# Patient Record
Sex: Male | Born: 1975 | Race: White | Hispanic: No | Marital: Married | Smoking: Never smoker
Health system: Southern US, Community
[De-identification: ages and names within clinical notes are randomized; demographics above are authoritative.]

## PROBLEM LIST (undated history)

## (undated) DIAGNOSIS — K219 Gastro-esophageal reflux disease without esophagitis: Secondary | ICD-10-CM

## (undated) DIAGNOSIS — Z973 Presence of spectacles and contact lenses: Secondary | ICD-10-CM

## (undated) DIAGNOSIS — R002 Palpitations: Secondary | ICD-10-CM

## (undated) DIAGNOSIS — I10 Essential (primary) hypertension: Secondary | ICD-10-CM

## (undated) DIAGNOSIS — G4730 Sleep apnea, unspecified: Secondary | ICD-10-CM

## (undated) HISTORY — PX: OTHER SURGICAL HISTORY: SHX169

---

## 2006-09-03 ENCOUNTER — Emergency Department (HOSPITAL_COMMUNITY): Admission: EM | Admit: 2006-09-03 | Discharge: 2006-09-03 | Payer: Self-pay | Admitting: Emergency Medicine

## 2006-09-10 ENCOUNTER — Emergency Department (HOSPITAL_COMMUNITY): Admission: EM | Admit: 2006-09-10 | Discharge: 2006-09-10 | Payer: Self-pay | Admitting: Emergency Medicine

## 2017-02-15 ENCOUNTER — Encounter (HOSPITAL_BASED_OUTPATIENT_CLINIC_OR_DEPARTMENT_OTHER): Payer: Self-pay | Admitting: *Deleted

## 2017-02-15 ENCOUNTER — Emergency Department (HOSPITAL_BASED_OUTPATIENT_CLINIC_OR_DEPARTMENT_OTHER)
Admission: EM | Admit: 2017-02-15 | Discharge: 2017-02-16 | Disposition: A | Discharge status: Home / Self Care | Payer: Self-pay | Attending: Emergency Medicine | Admitting: Emergency Medicine

## 2017-02-15 ENCOUNTER — Emergency Department (HOSPITAL_BASED_OUTPATIENT_CLINIC_OR_DEPARTMENT_OTHER): Payer: Self-pay

## 2017-02-15 DIAGNOSIS — R072 Precordial pain: Secondary | ICD-10-CM | POA: Insufficient documentation

## 2017-02-15 DIAGNOSIS — I493 Ventricular premature depolarization: Secondary | ICD-10-CM | POA: Insufficient documentation

## 2017-02-15 DIAGNOSIS — Z79899 Other long term (current) drug therapy: Secondary | ICD-10-CM | POA: Insufficient documentation

## 2017-02-15 LAB — COMPREHENSIVE METABOLIC PANEL
ALT: 34 U/L (ref 17–63)
AST: 21 U/L (ref 15–41)
Albumin: 4.5 g/dL (ref 3.5–5.0)
Alkaline Phosphatase: 60 U/L (ref 38–126)
Anion gap: 8 (ref 5–15)
BUN: 19 mg/dL (ref 6–20)
CHLORIDE: 104 mmol/L (ref 101–111)
CO2: 26 mmol/L (ref 22–32)
CREATININE: 1 mg/dL (ref 0.61–1.24)
Calcium: 9.3 mg/dL (ref 8.9–10.3)
GFR calc non Af Amer: >60 mL/min (ref >60)
Glucose, Bld: 105 mg/dL — ABNORMAL HIGH (ref 65–99)
Potassium: 3.8 mmol/L (ref 3.5–5.1)
SODIUM: 138 mmol/L (ref 135–145)
Total Bilirubin: 0.7 mg/dL (ref 0.3–1.2)
Total Protein: 7.5 g/dL (ref 6.5–8.1)

## 2017-02-15 LAB — CBC WITH DIFFERENTIAL/PLATELET
BASOS ABS: 0 10*3/uL (ref 0.0–0.1)
BASOS PCT: 1 %
EOS ABS: 0.2 10*3/uL (ref 0.0–0.7)
Eosinophils Relative: 3 %
HCT: 44 % (ref 39.0–52.0)
HEMOGLOBIN: 14.9 g/dL (ref 13.0–17.0)
Lymphocytes Relative: 36 %
Lymphs Abs: 2.9 10*3/uL (ref 0.7–4.0)
MCH: 27.9 pg (ref 26.0–34.0)
MCHC: 33.9 g/dL (ref 30.0–36.0)
MCV: 82.4 fL (ref 78.0–100.0)
Monocytes Absolute: 0.6 10*3/uL (ref 0.1–1.0)
Monocytes Relative: 7 %
NEUTROS PCT: 53 %
Neutro Abs: 4.2 10*3/uL (ref 1.7–7.7)
Platelets: 314 10*3/uL (ref 150–400)
RBC: 5.34 MIL/uL (ref 4.22–5.81)
RDW: 12.7 % (ref 11.5–15.5)
WBC: 7.8 10*3/uL (ref 4.0–10.5)

## 2017-02-15 LAB — TROPONIN I

## 2017-02-15 NOTE — ED Triage Notes (Signed)
Pt was seen for palpitations by his PCP yesterday no significant findings began having centra CP this am. Denies N/V diaphoresis radiation dizziness or weakness

## 2017-02-16 ENCOUNTER — Encounter (HOSPITAL_BASED_OUTPATIENT_CLINIC_OR_DEPARTMENT_OTHER): Payer: Self-pay | Admitting: Emergency Medicine

## 2017-02-16 LAB — TROPONIN I: Troponin I: <0.03 ng/mL (ref <0.03)

## 2017-02-16 MED ORDER — GI COCKTAIL ~~LOC~~
30.0000 mL | Freq: Once | ORAL | Status: AC
  Administered 2017-02-16: 30 mL via ORAL
  Filled 2017-02-16: qty 30

## 2017-02-16 NOTE — ED Provider Notes (Signed)
[facility] EMERGENCY DEPARTMENT Provider Note   CSN: [id number] Arrival date & time: 02/15/17  2008     History   Chief Complaint Chief Complaint  Patient presents with  . Chest Pain    HPI Jacob Estrada is a 41 y.o. male.  The history is provided by the patient.  Chest Pain   This is a new problem. The current episode started yesterday. The problem occurs constantly. The problem has not changed since onset.The pain is associated with rest and an emotional upset. The pain is present in the substernal region. The pain is moderate. The quality of the pain is described as dull. The pain does not radiate. Duration of episode(s) is 1 day. Pertinent negatives include no abdominal pain, no diaphoresis, no hemoptysis, no leg pain, no lower extremity edema, no orthopnea and no shortness of breath. Associated symptoms comments: A pvc.  Pertinent negatives for past medical history include no MI and no recent injury.  Pertinent negatives for family medical history include: no Marfan's syndrome.  No surgery, no travel no leg pain or swelling  History reviewed. No pertinent past medical history.  There are no active problems to display for this patient.   History reviewed. No pertinent surgical history.     Home Medications    Prior to Admission medications   Medication Sig Start Date End Date Taking? Authorizing Provider  omeprazole (PRILOSEC) 20 MG capsule Take 20 mg by mouth daily.   Yes [provider]    Family History History reviewed. No pertinent family history.  Social History Social History  Substance Use Topics  . Smoking status: Never Smoker  . Smokeless tobacco: Never Used  . Alcohol use No     Allergies   Patient has no known allergies.   Review of Systems Review of Systems  Constitutional: Negative for diaphoresis.  Respiratory: Negative for hemoptysis, chest tightness and shortness of breath.   Cardiovascular: Positive for chest  pain. Negative for orthopnea and leg swelling.  Gastrointestinal: Negative for abdominal pain.  All other systems reviewed and are negative.    Physical Exam Updated Vital Signs BP (!) 135/94   Pulse 62   Temp 98.2 F (36.8 C) (Oral)   Resp 11   Ht 6\' 1"  (1.854 m)   Wt 108.9 kg (240 lb)   SpO2 94%   BMI 31.66 kg/m   Physical Exam  Constitutional: He is oriented to person, place, and time. He appears well-developed and well-nourished. No distress.  HENT:  Head: Normocephalic and atraumatic.  Mouth/Throat: No oropharyngeal exudate.  Eyes: Pupils are equal, round, and reactive to light. Conjunctivae are normal.  Neck: Normal range of motion. Neck supple. No JVD present.  Cardiovascular: Normal rate, regular rhythm, normal heart sounds and intact distal pulses.   Pulmonary/Chest: Effort normal and breath sounds normal. No stridor. No respiratory distress. He has no wheezes. He has no rales.  Abdominal: Soft. Bowel sounds are normal. He exhibits no mass. There is no tenderness. There is no rebound and no guarding.  Musculoskeletal: Normal range of motion. He exhibits no edema or tenderness.  Negative Homan's sign  Neurological: He is alert and oriented to person, place, and time.  Skin: Skin is warm and dry. Capillary refill takes less than 2 seconds.     ED Treatments / Results  Labs (all labs ordered are listed, but only abnormal results are displayed)  Results for orders placed or performed during the hospital encounter of 02/15/17  CBC with  Differential  Result Value Ref Range   WBC 7.8 4.0 - 10.5 K/uL   RBC 5.34 4.22 - 5.81 MIL/uL   Hemoglobin 14.9 13.0 - 17.0 g/dL   HCT 16.1 09.6 - 04.5 %   MCV 82.4 78.0 - 100.0 fL   MCH 27.9 26.0 - 34.0 pg   MCHC 33.9 30.0 - 36.0 g/dL   RDW 40.9 81.1 - 91.4 %   Platelets 314 150 - 400 K/uL   Neutrophils Relative % 53 %   Neutro Abs 4.2 1.7 - 7.7 K/uL   Lymphocytes Relative 36 %   Lymphs Abs 2.9 0.7 - 4.0 K/uL   Monocytes  Relative 7 %   Monocytes Absolute 0.6 0.1 - 1.0 K/uL   Eosinophils Relative 3 %   Eosinophils Absolute 0.2 0.0 - 0.7 K/uL   Basophils Relative 1 %   Basophils Absolute 0.0 0.0 - 0.1 K/uL  Troponin I  Result Value Ref Range   Troponin I <0.03 <0.03 ng/mL  Comprehensive metabolic panel  Result Value Ref Range   Sodium 138 135 - 145 mmol/L   Potassium 3.8 3.5 - 5.1 mmol/L   Chloride 104 101 - 111 mmol/L   CO2 26 22 - 32 mmol/L   Glucose, Bld 105 (H) 65 - 99 mg/dL   BUN 19 6 - 20 mg/dL   Creatinine, Ser 7.82 0.61 - 1.24 mg/dL   Calcium 9.3 8.9 - 95.6 mg/dL   Total Protein 7.5 6.5 - 8.1 g/dL   Albumin 4.5 3.5 - 5.0 g/dL   AST 21 15 - 41 U/L   ALT 34 17 - 63 U/L   Alkaline Phosphatase 60 38 - 126 U/L   Total Bilirubin 0.7 0.3 - 1.2 mg/dL   GFR calc non Af Amer >60 >60 mL/min   GFR calc Af Amer >60 >60 mL/min   Anion gap 8 5 - 15  Troponin I  Result Value Ref Range   Troponin I <0.03 <0.03 ng/mL   Dg Chest 2 View  Result Date: 02/15/2017 CLINICAL DATA:  Palpitations and premature heart beat. Chest pain. Left-sided chest numbness. Nonsmoker. EXAM: CHEST  2 VIEW COMPARISON:  None. FINDINGS: The heart size and mediastinal contours are within normal limits. Both lungs are clear. The visualized skeletal structures are unremarkable. IMPRESSION: No active cardiopulmonary disease. Electronically Signed   By: Burman Nieves M.D.   On: 02/15/2017 21:48    EKG  EKG Interpretation  Date/Time:  Thursday February 15 2017 20:17:40 EDT Ventricular Rate:  81 PR Interval:  144 QRS Duration: 88 QT Interval:  362 QTC Calculation: 420 R Axis:   72 Text Interpretation:  Sinus rhythm with sinus arrhythmia  PVC Confirmed by Nicanor Alcon, Buell Parcel ([id number]) on 02/15/2017 11:27:36 PM       Radiology Dg Chest 2 View  Result Date: 02/15/2017 CLINICAL DATA:  Palpitations and premature heart beat. Chest pain. Left-sided chest numbness. Nonsmoker. EXAM: CHEST  2 VIEW COMPARISON:  None. FINDINGS: The heart  size and mediastinal contours are within normal limits. Both lungs are clear. The visualized skeletal structures are unremarkable. IMPRESSION: No active cardiopulmonary disease. Electronically Signed   By: Burman Nieves M.D.   On: 02/15/2017 21:48    Procedures Procedures (including critical care time)  Medications Ordered in ED Medications  gi cocktail (Maalox,Lidocaine,Donnatal) (30 mLs Oral Given 02/16/17 0103)   PERC negative Wells 0 highly doubt PE in this very low risk patient.  Sats 100% no SOB or leg pain   Final Clinical Impressions(s) /  ED Diagnoses   Final diagnoses:  Precordial pain  PVC (premature ventricular contraction)   Follow up with your PMD to discuss thyroid testing and holter monitor, no caffeine, lower your stress level.    Strict return precautions given for  Shortness of breath, swelling or the lips or tongue, chest pain, dyspnea on exertion, new weakness or numbness changes in vision or speech,  Inability to tolerate liquids or food, changes in voice cough, altered mental status or any concerns. No signs of systemic illness or infection. The patient is nontoxic-appearing on exam and vital signs are within normal limits.    I have reviewed the triage vital signs and the nursing notes. Pertinent labs &imaging results that were available during my care of the patient were reviewed by me and considered in my medical decision making (see chart for details).  After history, exam, and medical workup I feel the patient has been appropriately medically screened and is safe for discharge home. Pertinent diagnoses were discussed with the patient. Patient was given return precautions.      Callaway Hailes, MD 02/16/17 [phone]

## 2017-07-06 ENCOUNTER — Other Ambulatory Visit: Payer: Self-pay | Admitting: Orthopedic Surgery

## 2017-07-06 DIAGNOSIS — M25561 Pain in right knee: Principal | ICD-10-CM

## 2017-07-06 DIAGNOSIS — G8929 Other chronic pain: Secondary | ICD-10-CM

## 2017-07-16 ENCOUNTER — Ambulatory Visit
Admission: RE | Admit: 2017-07-16 | Discharge: 2017-07-16 | Disposition: A | Discharge status: Home / Self Care | Payer: Self-pay | Source: Ambulatory Visit | Attending: Orthopedic Surgery | Admitting: Orthopedic Surgery

## 2017-07-16 DIAGNOSIS — G8929 Other chronic pain: Secondary | ICD-10-CM

## 2017-07-16 DIAGNOSIS — M25561 Pain in right knee: Principal | ICD-10-CM

## 2018-05-29 ENCOUNTER — Other Ambulatory Visit: Payer: Self-pay | Admitting: Orthopedic Surgery

## 2018-05-29 DIAGNOSIS — R609 Edema, unspecified: Secondary | ICD-10-CM

## 2018-05-29 DIAGNOSIS — R52 Pain, unspecified: Secondary | ICD-10-CM

## 2018-05-29 DIAGNOSIS — R531 Weakness: Secondary | ICD-10-CM

## 2018-06-03 ENCOUNTER — Ambulatory Visit
Admission: RE | Admit: 2018-06-03 | Discharge: 2018-06-03 | Disposition: A | Discharge status: Home / Self Care | Payer: 59 | Source: Ambulatory Visit | Attending: Orthopedic Surgery | Admitting: Orthopedic Surgery

## 2018-06-03 DIAGNOSIS — R531 Weakness: Secondary | ICD-10-CM

## 2018-06-03 DIAGNOSIS — R52 Pain, unspecified: Secondary | ICD-10-CM

## 2018-06-03 DIAGNOSIS — R609 Edema, unspecified: Secondary | ICD-10-CM

## 2018-06-04 ENCOUNTER — Other Ambulatory Visit: Payer: Self-pay

## 2018-09-29 IMAGING — DX DG CHEST 2V
2 series · 2 of 2 positions shown · non-contrast
Comparison: None.

CLINICAL DATA: Palpitations and premature heart beat. Chest pain.
Left-sided chest numbness. Nonsmoker.

EXAM:
CHEST  2 VIEW

[chest pa]
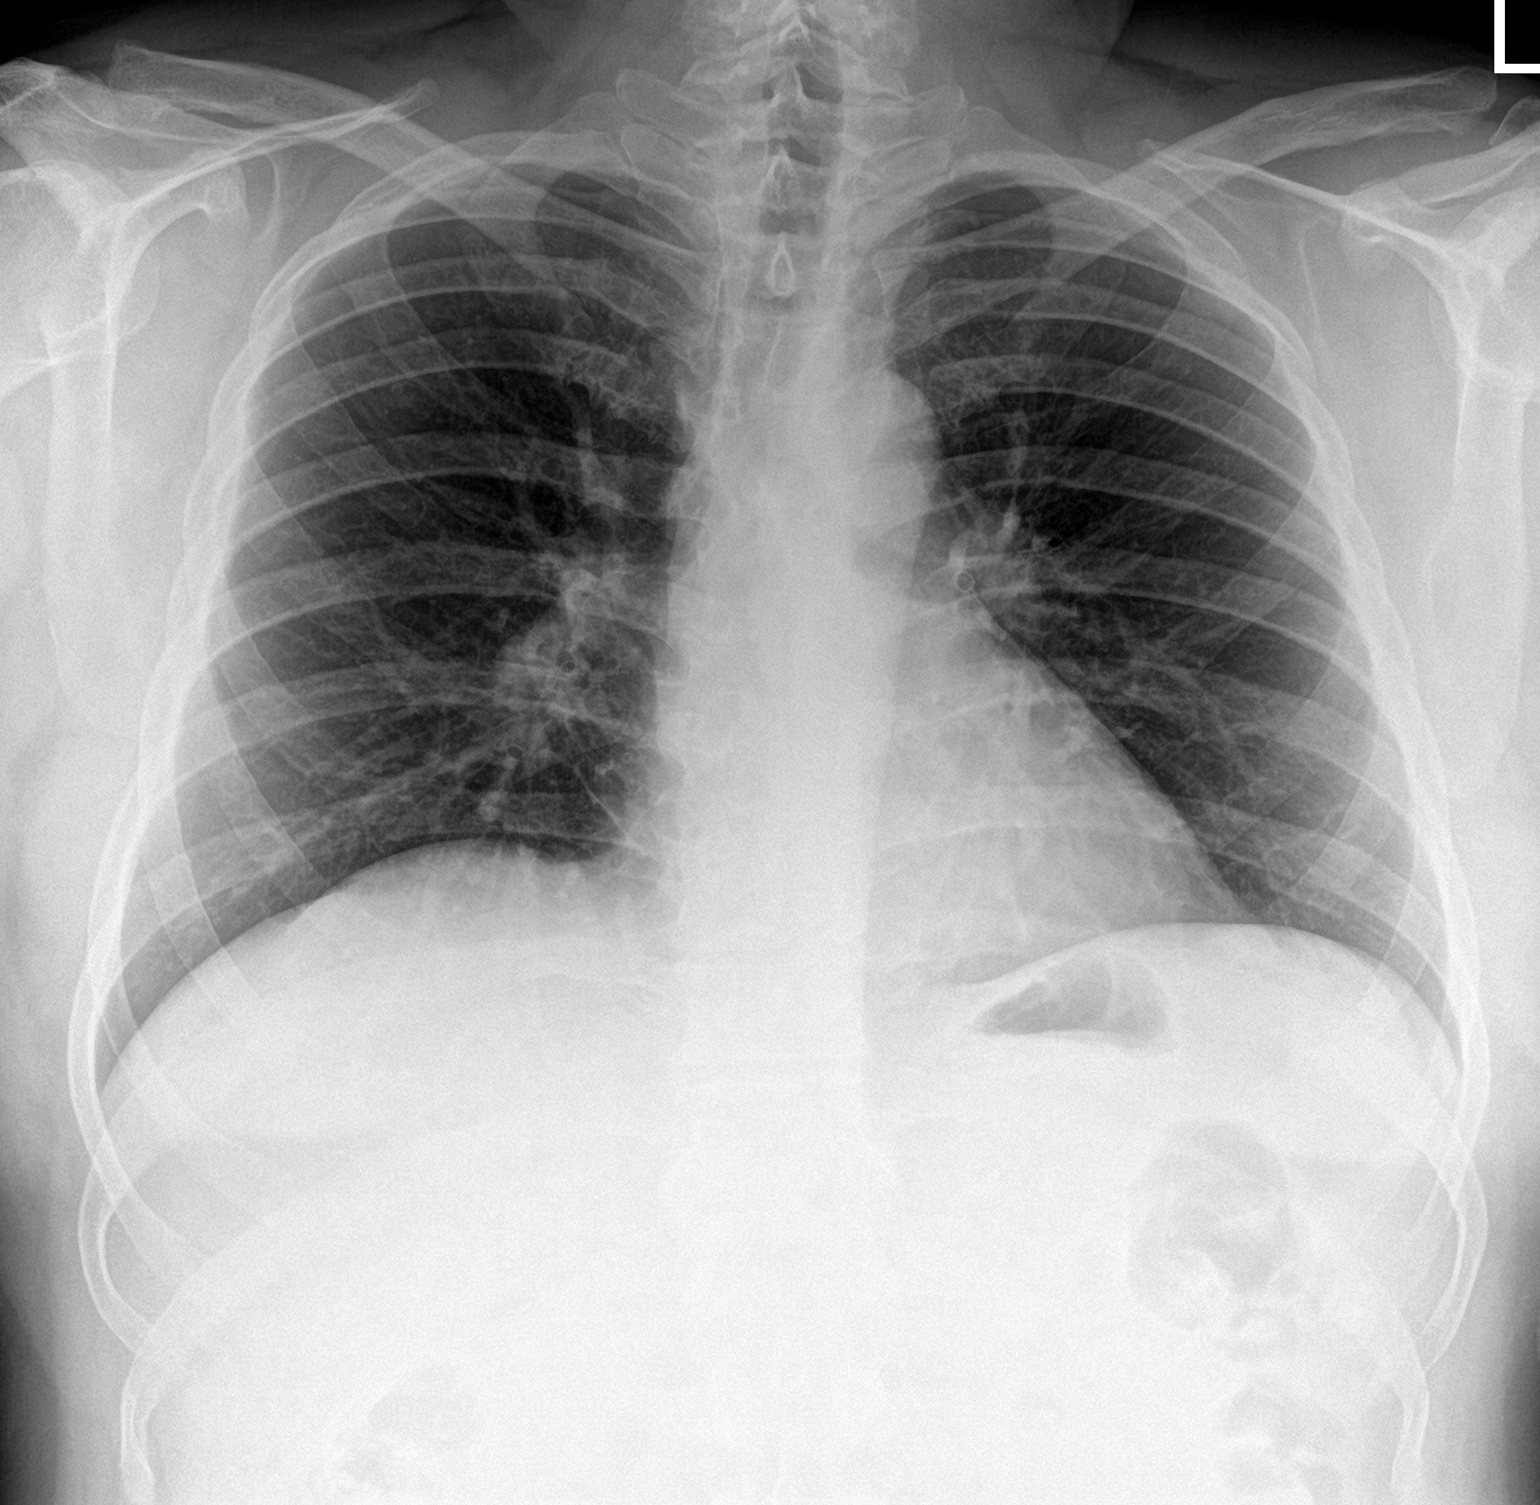

[chest lat]
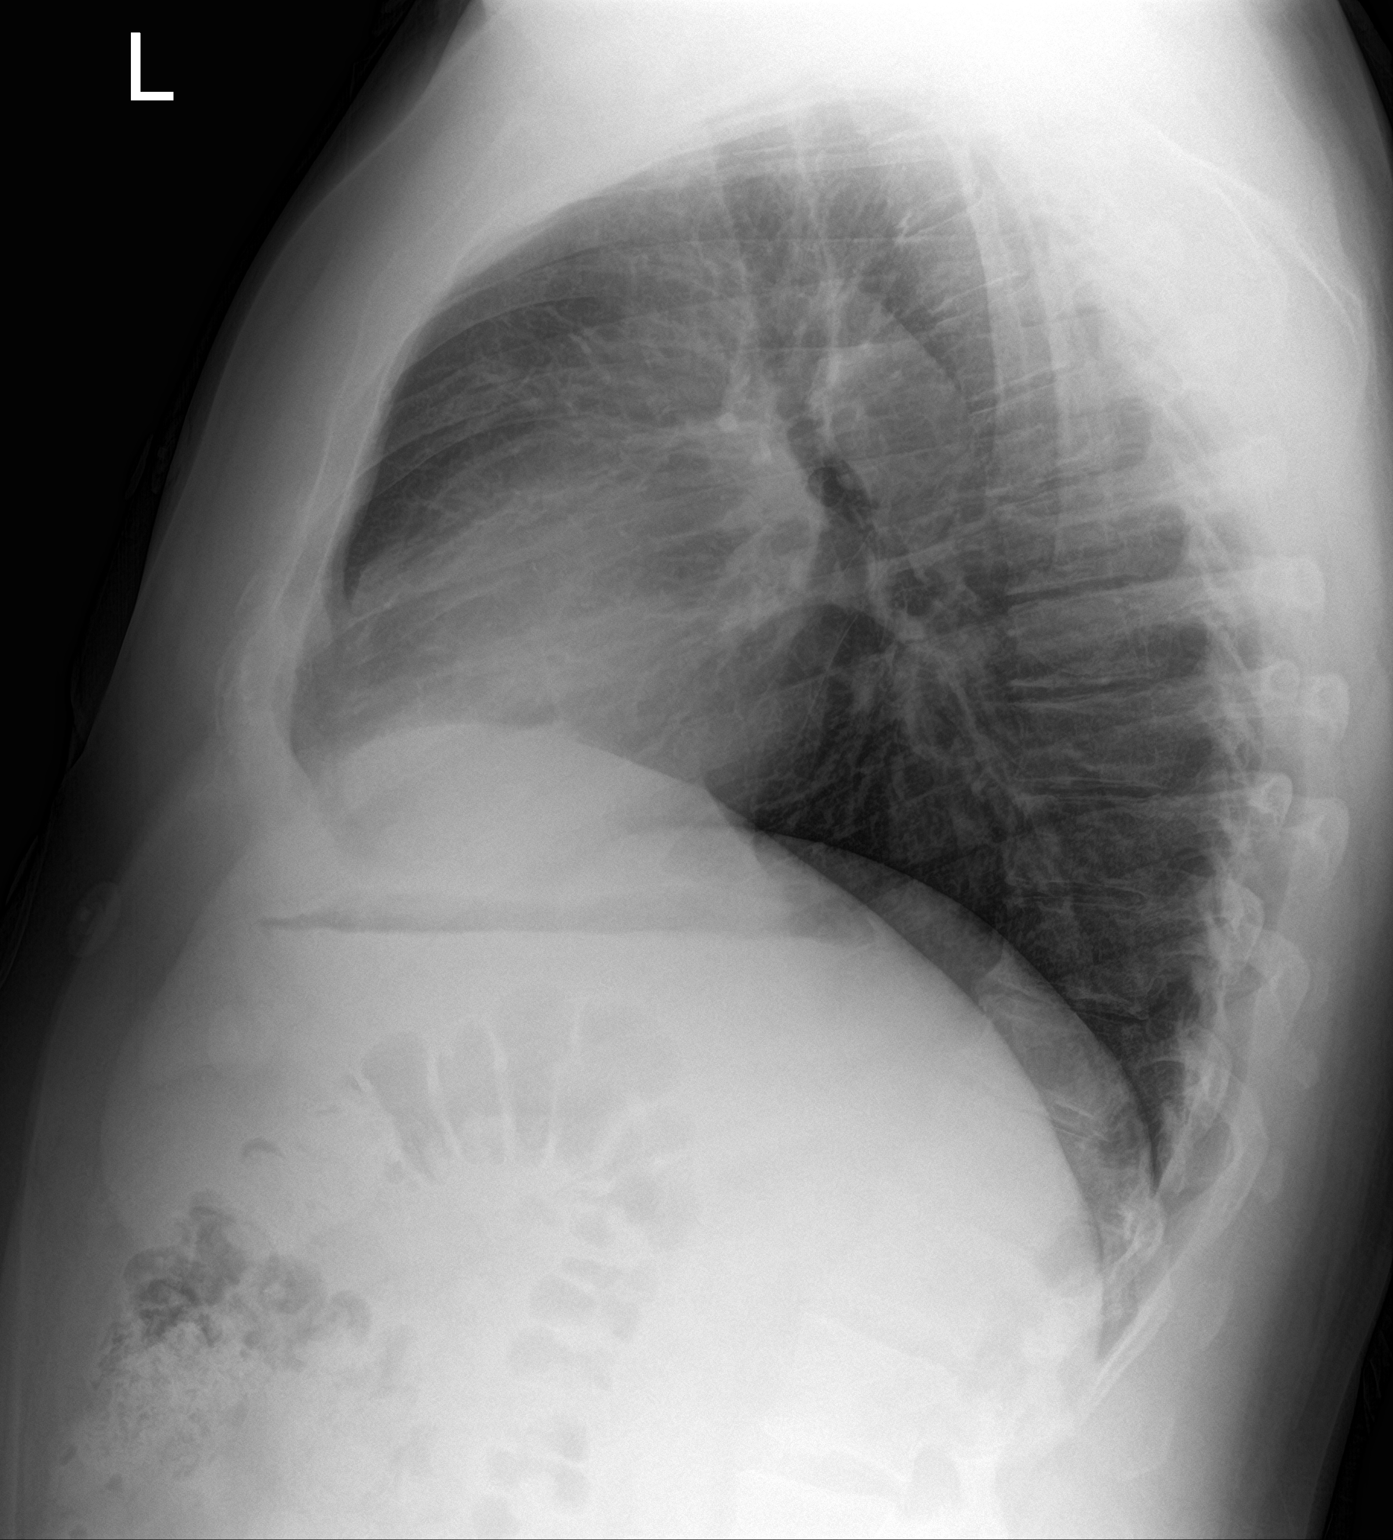

[2 of 2 positions shown; findings below may reference images not displayed]

FINDINGS: The heart size and mediastinal contours are within normal limits.
Both lungs are clear. The visualized skeletal structures are
unremarkable.
IMPRESSION: No active cardiopulmonary disease.

## 2019-02-27 IMAGING — MR MR KNEE*R* W/O CM
4 of 5 series · 31 of 40 positions shown · non-contrast
Comparison: None.

CLINICAL DATA: Right knee pain x4 years. Raised area beneath the
skin along the lateral aspect with markers placed. Exercise worsens
symptoms.

EXAM:
MRI OF THE RIGHT KNEE WITHOUT CONTRAST
TECHNIQUE: Multiplanar, multisequence MR imaging of the knee was performed. No
intravenous contrast was administered.

[Series 15: PD fat-sat · axial · right · 3.0mm · 0.39mm/px · z∈[-29,+98]mm · 9 of 40 slices shown (1 of 3)]
[im 1/40]
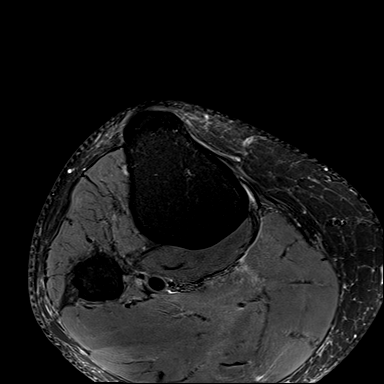
[im 5/40]
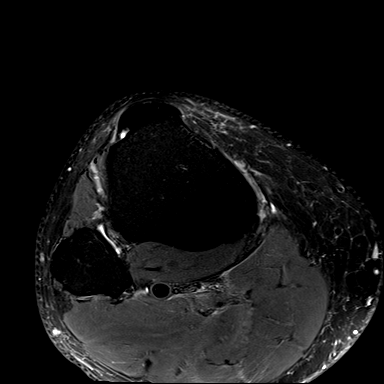
[im 10/40]
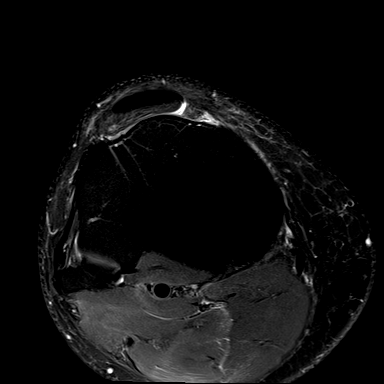
[im 15/40]
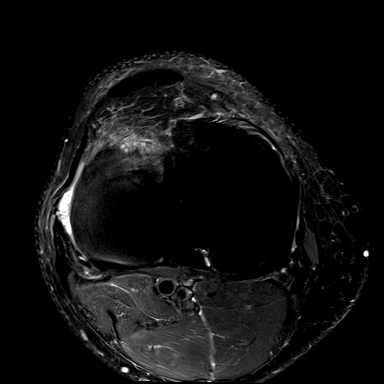
[im 20/40]
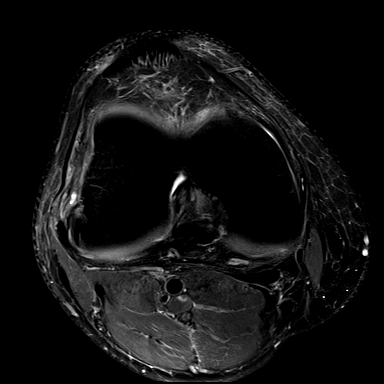
[im 25/40]
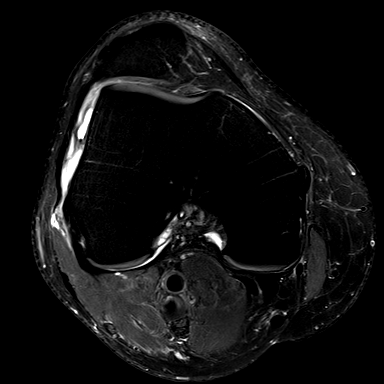
[im 30/40]
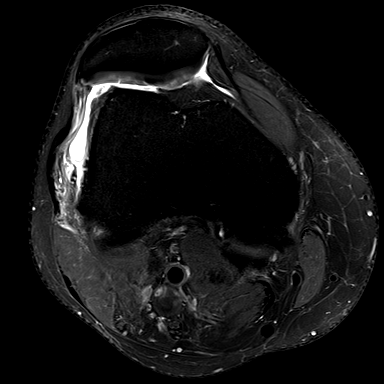
[im 35/40]
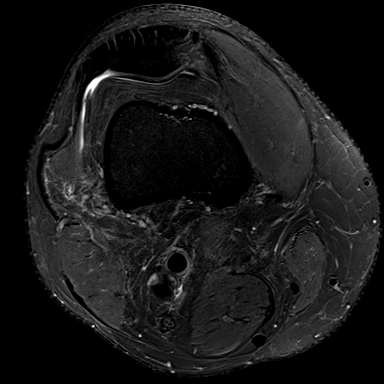
[im 40/40]
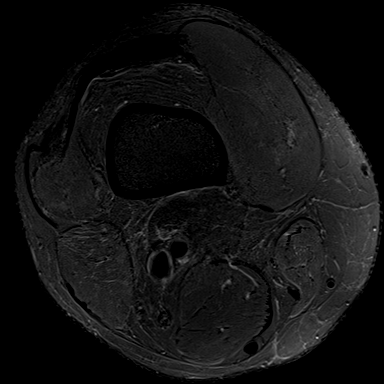

[Series 16: PD fat-sat · sagittal · right · 3.0mm · 0.39mm/px · 7 of 30 slices shown (2 of 3)]
[im 1/30]
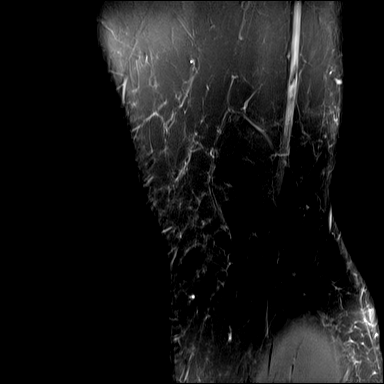
[im 5/30]
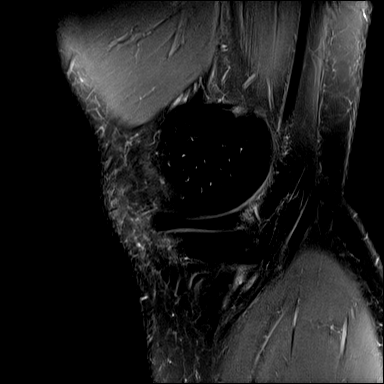
[im 10/30]
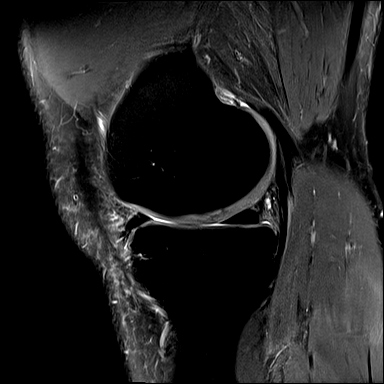
[im 15/30]
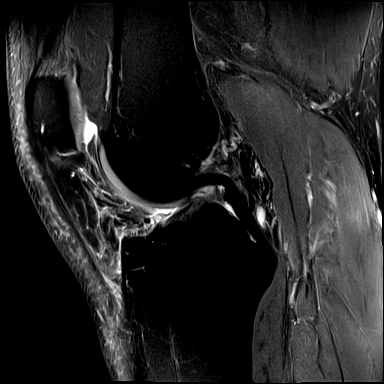
[im 20/30]
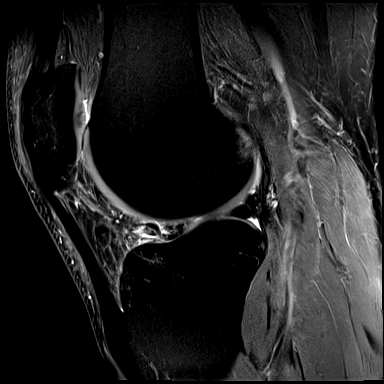
[im 25/30]
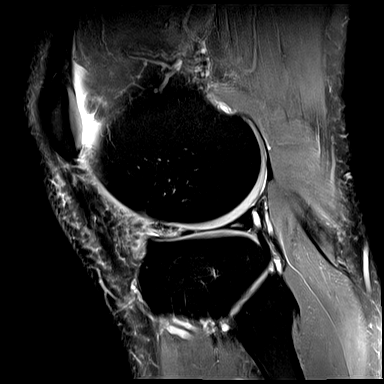
[im 30/30]
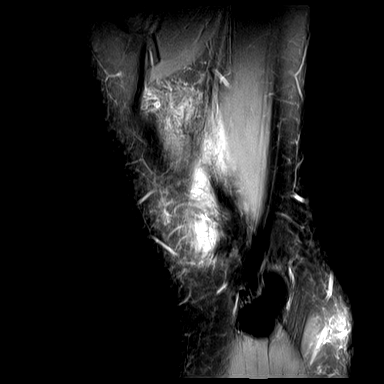

[Series 18: PD fat-sat · coronal · right · 3.0mm · 0.33mm/px · 8 of 33 slices shown (3 of 3)]
[im 1/33]
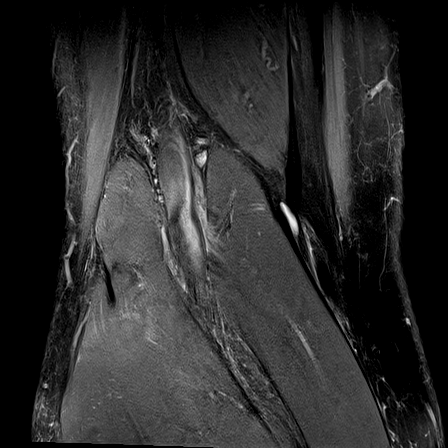
[im 5/33]
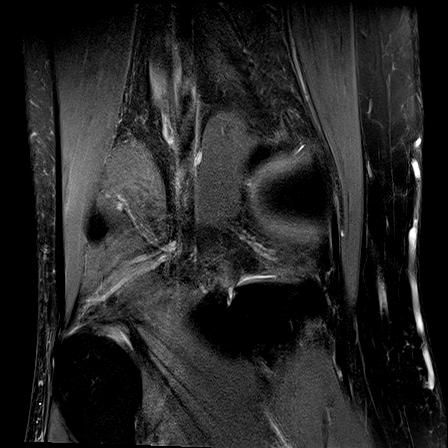
[im 10/33]
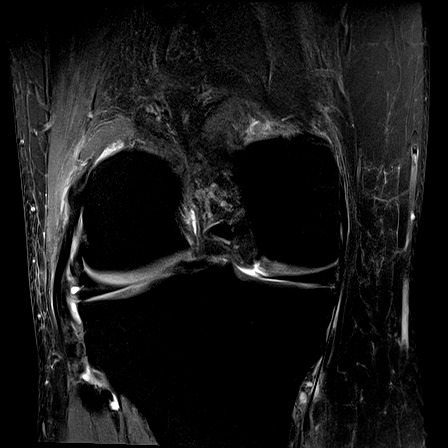
[im 14/33]
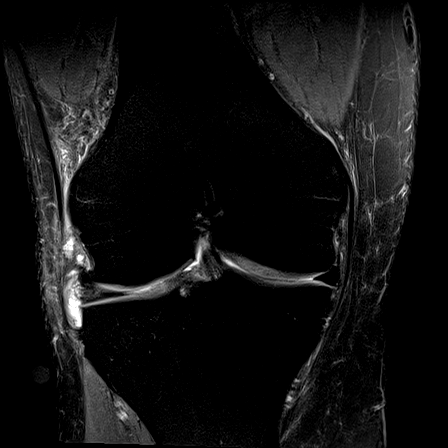
[im 19/33]
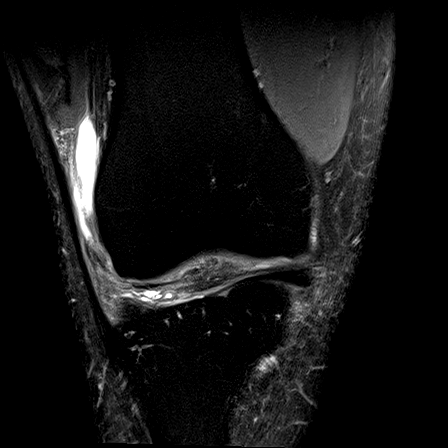
[im 23/33]
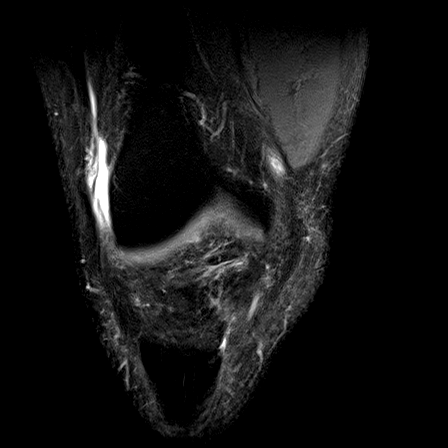
[im 28/33]
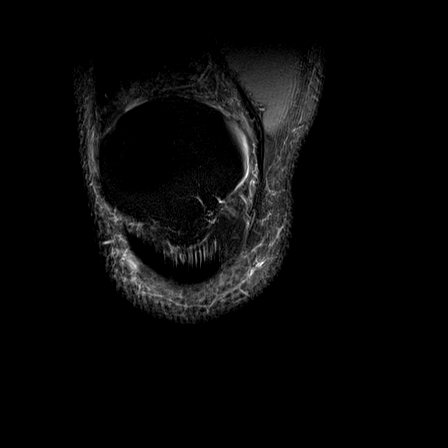
[im 33/33]
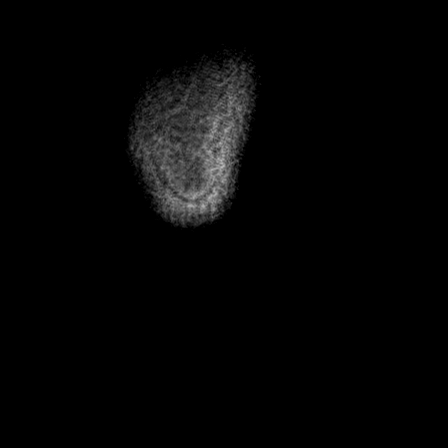

[Series 19: T2 fat-sat · coronal · right · 3.0mm · 0.39mm/px · 7 of 33 slices shown]
[im 1/33]
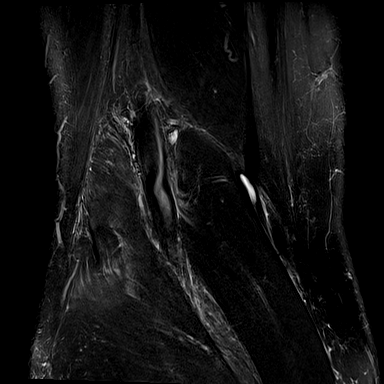
[im 5/33]
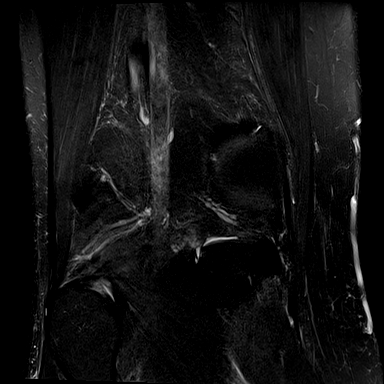
[im 10/33]
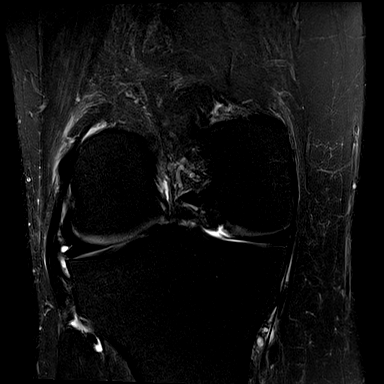
[im 14/33]
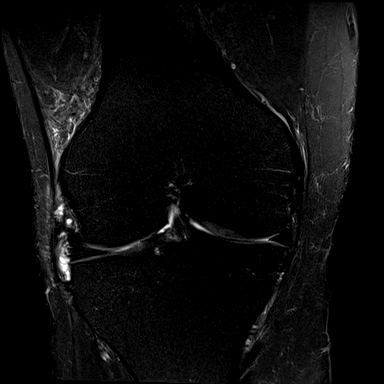
[im 19/33]
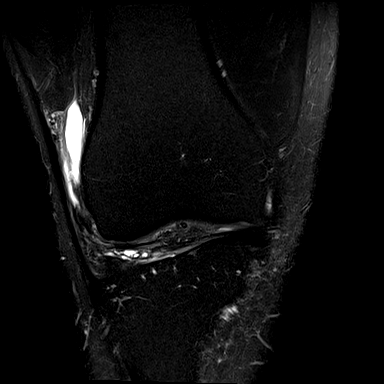
[im 23/33]
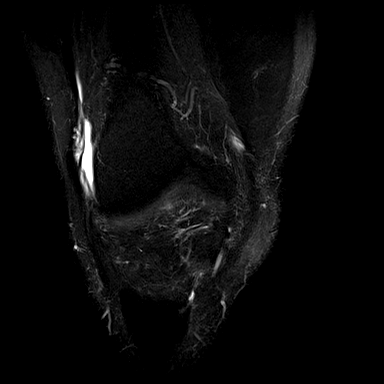
[im 28/33]
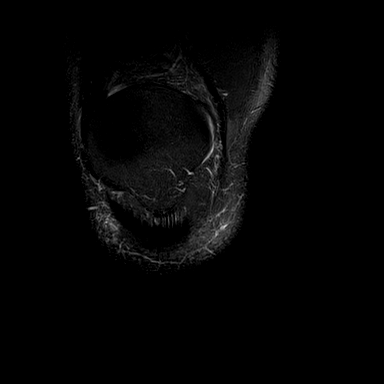

[31 of 40 positions shown; findings below may reference images not displayed]

FINDINGS: MENISCI

Medial meniscus:  Intact.

Lateral meniscus: A complex tear of the anteromesial horn of the
lateral meniscus is noted surfacing both superiorly and inferiorly.
A horizontal component extends from this region to the body of the
lateral meniscus where there are 2 small parameniscal cysts on the
order of 2-3 mm noted, series [DATE].

LIGAMENTS

Cruciates:  Intact

Collaterals: Thickened edematous lateral collateral ligament with
overlying edema. Intact MCL.

CARTILAGE

Patellofemoral: Slight fissuring of the cartilage overlying the
lateral and medial patella as well as the cartilage overlying the
median ridge and mid pole. Intact trochlear cartilage.

Medial: Mild shallow delamination of the cartilage overlying the
posterior aspect of the medial femoral condyle, series [DATE].

Lateral:  Intact

Joint: No significant joint effusion. Mild edema of Hoffa's fat pad
with deep infrapatellar bursal fluid.

Popliteal Fossa:  No popliteal cyst.  Intact popliteus.

Extensor Mechanism:  Intact extensor mechanism tendons and MPFL.

Bones: No focal marrow signal abnormality. No fracture or
dislocation.

Other: None.
IMPRESSION: 1. Complex tear of the anteromesial corner of the lateral meniscus
with horizontal component extending to the body of the lateral
meniscus where there are a few small parameniscal cysts noted.
2. Minimal fissuring of the patellar cartilage overlying the lateral
and medial patellar facets as well as median ridge the midpole.
3. Mild shallow delamination of the cartilage overlying the medial
femoral condyle.
4. Grade 2 sprain of the LCL with overlying soft tissue edema.
5. Deep infrapatellar bursitis.

## 2022-03-11 ENCOUNTER — Other Ambulatory Visit: Payer: Self-pay | Admitting: Urology

## 2022-03-11 ENCOUNTER — Other Ambulatory Visit: Payer: Self-pay

## 2022-03-11 DIAGNOSIS — N50811 Right testicular pain: Secondary | ICD-10-CM

## 2022-03-14 ENCOUNTER — Other Ambulatory Visit: Payer: 59

## 2022-03-16 ENCOUNTER — Ambulatory Visit
Admission: RE | Admit: 2022-03-16 | Discharge: 2022-03-16 | Disposition: A | Discharge status: Home / Self Care | Payer: BC Managed Care – PPO | Source: Ambulatory Visit | Attending: Urology | Admitting: Urology

## 2022-03-16 DIAGNOSIS — N50811 Right testicular pain: Secondary | ICD-10-CM

## 2022-06-20 ENCOUNTER — Other Ambulatory Visit: Payer: Self-pay | Admitting: Urology

## 2022-08-11 ENCOUNTER — Encounter (HOSPITAL_BASED_OUTPATIENT_CLINIC_OR_DEPARTMENT_OTHER): Admission: RE | Payer: Self-pay | Source: Home / Self Care

## 2022-08-11 ENCOUNTER — Ambulatory Visit (HOSPITAL_BASED_OUTPATIENT_CLINIC_OR_DEPARTMENT_OTHER): Admission: RE | Admit: 2022-08-11 | Payer: BC Managed Care – PPO | Source: Home / Self Care | Admitting: Urology

## 2022-08-11 SURGERY — EXPLORATION, SCROTUM
Anesthesia: General

## 2022-12-26 ENCOUNTER — Other Ambulatory Visit: Payer: Self-pay | Admitting: Urology

## 2023-01-08 ENCOUNTER — Other Ambulatory Visit: Payer: Self-pay

## 2023-01-08 ENCOUNTER — Encounter (HOSPITAL_BASED_OUTPATIENT_CLINIC_OR_DEPARTMENT_OTHER): Payer: Self-pay | Admitting: Urology

## 2023-01-08 NOTE — Progress Notes (Addendum)
Spoke w/ via phone for pre-op interview---pt Lab needs dos---- I stat, ekg              Lab results------see below COVID test -----patient states asymptomatic no test needed Arrive at -------800 01-12-2023 NPO after MN NO Solid Food.  Clear liquids from MN until---700 Med rec completed Medications to take morning of surgery -----atorvastain, sertraline, atenolol Diabetic medication -----n/a Patient instructed no nail polish to be worn day of surgery Patient instructed to bring photo id and insurance card day of surgery Patient aware to have Driver (ride ) / caregiver    for 24 hours after surgery wife [facility] Patient Special Instructions -----pt received no instructions per dr gay on 81 mg asa, last dose to be 01-11-2023 Pre-Op special Instructions -----bring cpap mask tubing and maching and leave in car Patient verbalized understanding of instructions that were given at this phone interview. Patient denies shortness of breath, chest pain, fever, cough at this phone interview.  Er visit [facility] 07-29-2022  epic (saw for chest pain)  Er visit [facility] 08-01-2022  epic (saw for chest pain)  Pcp visit annual physical exam 11-06-2022 [facility]ra[facility] epic  Reviewed patient history and er visits 07-29-2022 and 08-01-2022 and pcp annual physical exam with dr s, [facility] mda, pt ok for [facility] surgery 01-12-2023 per dr s Alben Deeds mda.

## 2023-01-12 ENCOUNTER — Encounter (HOSPITAL_BASED_OUTPATIENT_CLINIC_OR_DEPARTMENT_OTHER)
Admission: RE | Disposition: A | Discharge status: Home / Self Care | Payer: Self-pay | Source: Home / Self Care | Attending: Urology

## 2023-01-12 ENCOUNTER — Encounter (HOSPITAL_BASED_OUTPATIENT_CLINIC_OR_DEPARTMENT_OTHER): Payer: Self-pay | Admitting: Urology

## 2023-01-12 ENCOUNTER — Ambulatory Visit (HOSPITAL_BASED_OUTPATIENT_CLINIC_OR_DEPARTMENT_OTHER)
Admission: RE | Admit: 2023-01-12 | Discharge: 2023-01-12 | Disposition: A | Discharge status: Home / Self Care | Payer: BC Managed Care – PPO | Attending: Urology | Admitting: Urology

## 2023-01-12 ENCOUNTER — Other Ambulatory Visit: Payer: Self-pay

## 2023-01-12 ENCOUNTER — Ambulatory Visit (HOSPITAL_BASED_OUTPATIENT_CLINIC_OR_DEPARTMENT_OTHER): Payer: BC Managed Care – PPO | Admitting: Anesthesiology

## 2023-01-12 DIAGNOSIS — N509 Disorder of male genital organs, unspecified: Secondary | ICD-10-CM

## 2023-01-12 DIAGNOSIS — I10 Essential (primary) hypertension: Secondary | ICD-10-CM | POA: Diagnosis not present

## 2023-01-12 DIAGNOSIS — G4730 Sleep apnea, unspecified: Secondary | ICD-10-CM | POA: Insufficient documentation

## 2023-01-12 DIAGNOSIS — L729 Follicular cyst of the skin and subcutaneous tissue, unspecified: Secondary | ICD-10-CM | POA: Diagnosis present

## 2023-01-12 DIAGNOSIS — Z01818 Encounter for other preprocedural examination: Secondary | ICD-10-CM

## 2023-01-12 DIAGNOSIS — N5082 Scrotal pain: Secondary | ICD-10-CM | POA: Insufficient documentation

## 2023-01-12 HISTORY — DX: Essential (primary) hypertension: I10

## 2023-01-12 HISTORY — DX: Gastro-esophageal reflux disease without esophagitis: K21.9

## 2023-01-12 HISTORY — DX: Presence of spectacles and contact lenses: Z97.3

## 2023-01-12 HISTORY — PX: SCROTAL EXPLORATION: SHX2386

## 2023-01-12 HISTORY — DX: Palpitations: R00.2

## 2023-01-12 HISTORY — DX: Sleep apnea, unspecified: G47.30

## 2023-01-12 HISTORY — PX: VASECTOMY: SHX75

## 2023-01-12 LAB — POCT I-STAT, CHEM 8
BUN: 20 mg/dL (ref 6–20)
Calcium, Ion: 1.23 mmol/L (ref 1.15–1.40)
Chloride: 104 mmol/L (ref 98–111)
Creatinine, Ser: 0.9 mg/dL (ref 0.61–1.24)
Glucose, Bld: 115 mg/dL — ABNORMAL HIGH (ref 70–99)
HCT: 45 % (ref 39.0–52.0)
Hemoglobin: 15.3 g/dL (ref 13.0–17.0)
Potassium: 4.3 mmol/L (ref 3.5–5.1)
Sodium: 141 mmol/L (ref 135–145)
TCO2: 24 mmol/L (ref 22–32)

## 2023-01-12 SURGERY — EXPLORATION, SCROTUM
Anesthesia: General | Laterality: Right

## 2023-01-12 MED ORDER — EPHEDRINE SULFATE (PRESSORS) 50 MG/ML IJ SOLN
INTRAMUSCULAR | Status: DC | PRN
  Administered 2023-01-12: 5 mg via INTRAVENOUS

## 2023-01-12 MED ORDER — MIDAZOLAM HCL 2 MG/2ML IJ SOLN
INTRAMUSCULAR | Status: AC
  Filled 2023-01-12: qty 2

## 2023-01-12 MED ORDER — LIDOCAINE HCL (PF) 2 % IJ SOLN
INTRAMUSCULAR | Status: AC
  Filled 2023-01-12: qty 5

## 2023-01-12 MED ORDER — PROPOFOL 10 MG/ML IV BOLUS
INTRAVENOUS | Status: AC
  Filled 2023-01-12: qty 20

## 2023-01-12 MED ORDER — CEPHALEXIN 500 MG PO CAPS: 500.0000 mg | ORAL_CAPSULE | Freq: Two times a day (BID) | ORAL | 0 refills | Status: AC

## 2023-01-12 MED ORDER — OXYCODONE-ACETAMINOPHEN 5-325 MG PO TABS
1.0000 | ORAL_TABLET | ORAL | 0 refills | Status: AC | PRN
Start: 2023-01-12 — End: ?

## 2023-01-12 MED ORDER — CEFAZOLIN SODIUM-DEXTROSE 2-4 GM/100ML-% IV SOLN
INTRAVENOUS | Status: AC
  Filled 2023-01-12: qty 100

## 2023-01-12 MED ORDER — KETOROLAC TROMETHAMINE 30 MG/ML IJ SOLN
INTRAMUSCULAR | Status: DC | PRN
  Administered 2023-01-12: 30 mg via INTRAVENOUS

## 2023-01-12 MED ORDER — FENTANYL CITRATE (PF) 100 MCG/2ML IJ SOLN
INTRAMUSCULAR | Status: AC
  Filled 2023-01-12: qty 2

## 2023-01-12 MED ORDER — FENTANYL CITRATE (PF) 100 MCG/2ML IJ SOLN: 25.0000 ug | INTRAMUSCULAR | Status: DC | PRN

## 2023-01-12 MED ORDER — CEFAZOLIN SODIUM-DEXTROSE 2-4 GM/100ML-% IV SOLN
2.0000 g | INTRAVENOUS | Status: AC
  Administered 2023-01-12: 2 g via INTRAVENOUS

## 2023-01-12 MED ORDER — DEXAMETHASONE SODIUM PHOSPHATE 10 MG/ML IJ SOLN
INTRAMUSCULAR | Status: DC | PRN
  Administered 2023-01-12: 10 mg via INTRAVENOUS

## 2023-01-12 MED ORDER — ACETAMINOPHEN 500 MG PO TABS
1000.0000 mg | ORAL_TABLET | Freq: Once | ORAL | Status: AC
  Administered 2023-01-12: 1000 mg via ORAL

## 2023-01-12 MED ORDER — LIDOCAINE HCL (PF) 1 % IJ SOLN
INTRAMUSCULAR | Status: DC | PRN
  Administered 2023-01-12: 18 mL

## 2023-01-12 MED ORDER — DROPERIDOL 2.5 MG/ML IJ SOLN: 0.6250 mg | Freq: Once | INTRAMUSCULAR | Status: DC | PRN

## 2023-01-12 MED ORDER — ONDANSETRON HCL 4 MG/2ML IJ SOLN
INTRAMUSCULAR | Status: DC | PRN
  Administered 2023-01-12: 4 mg via INTRAVENOUS

## 2023-01-12 MED ORDER — OXYCODONE HCL 5 MG PO TABS: 5.0000 mg | ORAL_TABLET | Freq: Once | ORAL | Status: DC | PRN

## 2023-01-12 MED ORDER — ONDANSETRON HCL 4 MG/2ML IJ SOLN
INTRAMUSCULAR | Status: AC
  Filled 2023-01-12: qty 2

## 2023-01-12 MED ORDER — DOCUSATE SODIUM 100 MG PO CAPS: 100.0000 mg | ORAL_CAPSULE | Freq: Every day | ORAL | 0 refills | Status: AC | PRN

## 2023-01-12 MED ORDER — ACETAMINOPHEN 500 MG PO TABS
ORAL_TABLET | ORAL | Status: AC
  Filled 2023-01-12: qty 2

## 2023-01-12 MED ORDER — OXYCODONE HCL 5 MG/5ML PO SOLN: 5.0000 mg | Freq: Once | ORAL | Status: DC | PRN

## 2023-01-12 MED ORDER — LACTATED RINGERS IV SOLN: INTRAVENOUS | Status: DC

## 2023-01-12 MED ORDER — LIDOCAINE 2% (20 MG/ML) 5 ML SYRINGE
INTRAMUSCULAR | Status: DC | PRN
  Administered 2023-01-12: 100 mg via INTRAVENOUS

## 2023-01-12 MED ORDER — PROPOFOL 10 MG/ML IV BOLUS
INTRAVENOUS | Status: DC | PRN
  Administered 2023-01-12: 200 mg via INTRAVENOUS

## 2023-01-12 MED ORDER — MIDAZOLAM HCL 5 MG/5ML IJ SOLN
INTRAMUSCULAR | Status: DC | PRN
  Administered 2023-01-12: 2 mg via INTRAVENOUS

## 2023-01-12 MED ORDER — DEXAMETHASONE SODIUM PHOSPHATE 10 MG/ML IJ SOLN
INTRAMUSCULAR | Status: AC
  Filled 2023-01-12: qty 1

## 2023-01-12 MED ORDER — FENTANYL CITRATE (PF) 100 MCG/2ML IJ SOLN
INTRAMUSCULAR | Status: DC | PRN
  Administered 2023-01-12 (×2): 50 ug via INTRAVENOUS

## 2023-01-12 SURGICAL SUPPLY — 49 items
ADH SKN CLS APL DERMABOND .7 (GAUZE/BANDAGES/DRESSINGS) ×2
BLADE CLIPPER SENSICLIP SURGIC (BLADE) ×2 IMPLANT
BLADE SURG 15 STRL LF DISP TIS (BLADE) ×2 IMPLANT
BLADE SURG 15 STRL SS (BLADE) ×2
BNDG GAUZE DERMACEA FLUFF 4 (GAUZE/BANDAGES/DRESSINGS) IMPLANT
BNDG GZE DERMACEA 4 6PLY (GAUZE/BANDAGES/DRESSINGS)
CLIP TI MEDIUM 6 (CLIP) ×2 IMPLANT
COVER BACK TABLE 60X90IN (DRAPES) ×2 IMPLANT
COVER MAYO STAND STRL (DRAPES) ×2 IMPLANT
COVER PROBE U/S 5X48 (MISCELLANEOUS) IMPLANT
DERMABOND ADVANCED .7 DNX12 (GAUZE/BANDAGES/DRESSINGS) IMPLANT
DISSECTOR ROUND CHERRY 3/8 STR (MISCELLANEOUS) IMPLANT
DRAIN PENROSE 0.5X18 (DRAIN) ×2 IMPLANT
DRAPE LAPAROTOMY 100X72 PEDS (DRAPES) ×2 IMPLANT
DRSG TEGADERM 2-3/8X2-3/4 SM (GAUZE/BANDAGES/DRESSINGS) IMPLANT
ELECT NDL TIP 2.8 STRL (NEEDLE) ×2 IMPLANT
ELECT NEEDLE TIP 2.8 STRL (NEEDLE)
ELECT REM PT RETURN 9FT ADLT (ELECTROSURGICAL) ×2
ELECTRODE REM PT RTRN 9FT ADLT (ELECTROSURGICAL) ×2 IMPLANT
GAUZE 4X4 16PLY ~~LOC~~+RFID DBL (SPONGE) ×2 IMPLANT
GLOVE BIO SURGEON STRL SZ7 (GLOVE) ×2 IMPLANT
GLOVE BIO SURGEON STRL SZ7.5 (GLOVE) ×2 IMPLANT
GOWN STRL REUS W/TWL LRG LVL3 (GOWN DISPOSABLE) ×4 IMPLANT
GOWN STRL REUS W/TWL XL LVL3 (GOWN DISPOSABLE) ×2 IMPLANT
KIT TURNOVER CYSTO (KITS) ×2 IMPLANT
NDL HYPO 25X1 1.5 SAFETY (NEEDLE) ×2 IMPLANT
NEEDLE HYPO 25X1 1.5 SAFETY (NEEDLE) ×2
NS IRRIG 500ML POUR BTL (IV SOLUTION) IMPLANT
PACK BASIN DAY SURGERY FS (CUSTOM PROCEDURE TRAY) ×2 IMPLANT
PENCIL SMOKE EVACUATOR (MISCELLANEOUS) ×2 IMPLANT
SLEEVE SCD COMPRESS KNEE MED (STOCKING) ×2 IMPLANT
SUPPORTER AHLETIC TETRA LG (SOFTGOODS) ×2 IMPLANT
SUT CHROMIC 3 0 SH 27 (SUTURE) IMPLANT
SUT CHROMIC 4 0 SH 27 (SUTURE) IMPLANT
SUT MNCRL AB 4-0 PS2 18 (SUTURE) IMPLANT
SUT PROLENE 5 0 P 3 (SUTURE) ×2 IMPLANT
SUT SILK 0 TIES 10X30 (SUTURE) ×2 IMPLANT
SUT VIC AB 0 CT1 27 (SUTURE)
SUT VIC AB 0 CT1 27XBRD ANBCTR (SUTURE) ×2 IMPLANT
SUT VIC AB 3-0 SH 27 (SUTURE)
SUT VIC AB 3-0 SH 27X BRD (SUTURE) IMPLANT
SUT VIC AB 4-0 PS2 18 (SUTURE) ×2 IMPLANT
SYR 30ML LL (SYRINGE) IMPLANT
SYR CONTROL 10ML LL (SYRINGE) ×2 IMPLANT
TOWEL OR 17X24 6PK STRL BLUE (TOWEL DISPOSABLE) ×4 IMPLANT
TRAY DSU PREP LF (CUSTOM PROCEDURE TRAY) ×2 IMPLANT
TUBE CONNECTING 12X1/4 (SUCTIONS) ×2 IMPLANT
WATER STERILE IRR 500ML POUR (IV SOLUTION) ×2 IMPLANT
YANKAUER SUCT BULB TIP NO VENT (SUCTIONS) ×2 IMPLANT

## 2023-01-12 NOTE — Op Note (Signed)
Operative Note  Preoperative diagnosis:  1.  Recurrent right scrotal cyst  Postoperative diagnosis: 1.  Sane  Procedure(s): 1.  Scrotal exploration 2.  Right sided vasectomy 3.  Excision of right scrotal cyst sac  Surgeon: Jettie Pagan, MD  Assistants:  None  Anesthesia:  General  Complications:  None  EBL: Minimal  Specimens: 1.  ID Type Source Tests Collected by Time Destination  1 : right scrotal cyst wall Tissue PATH Soft tissue SURGICAL PATHOLOGY Jannifer Hick, MD 01/12/2023 1024    Drains/Catheters: 1.  None  Intraoperative findings:   Right scrotal cyst had been decompressed with surrounding evidence of inflammation score.  The sac was sharply excised and fulgurated with excellent hemostasis. Right side vasectomy performed proximal to the prior site (closer to the testicular end).  Indication:  Jacob Estrada is a 47 y.o. male who underwent an uncomplicated vasectomy by Dr. Ronne Binning in 11/2018.  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  Approximately year after the operation, he noted scar tissue associated with complaints of dull aching in his right testicle.  Exam demonstrated a nontender cystic area palpated along the right spermatic cord.  Scrotal ultrasound 03/2022 revealed 1.7 x 1.5 cm heterogenous complex hypoechoic structure without internal vascularity Rez a possible complex cyst versus abscess.  He was treated with Dosepak, meloxicam with significant reduction in size.  He was initially scheduled for scrotal exploration on 5/24 however canceled this procedure as the cyst resolved.  Unfortunately, cyst recurred and he was again scheduled for surgery.  Today however, he reports that the cyst has again resolved.  He would like to present with school expiration however to excise scrotal sac. After thorough discussion including all relevant risk but this alternatives, he presents the operating for the above procedure.  Description of  procedure: The indications, alternatives, benefits, and risks were discussed with the patient and informed was obtained.  The patient was brought onto the operating room table, positioned supine, and secured with a safety strap.  All pressure points were carefully padded and pneumatic compression devices were placed on the lower extremities.  After the administration of intravenous antibiotics and general anesthesia, the patient scrotum was examined the area of palpable abnormality along the anterior right superior aspect of the scrotum was identified.  Lidocaine was injected and a small approximate 1 inch incision was made.  I dissected down and identified his prior vasectomy site with mild amount of inflammatory scar present.  I do not see any clear evidence of a fistula from his vas deferens.  However as this was suspicious given history, elected proceed with a right sided vasectomy proximal to his prior site and closer to the testicular end.  The vas deferens was grasped with a ring clamp and the subcutaneous tissues were dissected off.  Approximately 2 cm area of vas deferens was excised and this was discarded.  The lumens were then fulgurated with cautery and each end was tied using 0 silk sutures.  Excellent hemostasis was obtained.  I then proceeded to dissect to the area of prior scrotal cyst.  I encountered a cyst wall with residual inflammatory rind.  This was then sharply excised and passed off as right cyst wall.  The area was fulgurated extensively and excellent hemostasis was obtained.  I then surveyed the testicle, spermatic cord that revealed no injury and no evidence of bleeding.  I then closed the right scrotal incision in 2 layers with Vicryl sutures.  I then  closed the skin with 4-0 Monocryl.  Dermabond was then applied.  At the end the procedure all counts were correct.  The patient tolerated the procedure well was taken the recovery room in stable condition.  Matt R. Jacquese Cassarino MD [facility]  Pager: [phone]

## 2023-01-12 NOTE — Anesthesia Preprocedure Evaluation (Addendum)
Anesthesia Evaluation  Patient identified by MRN, date of birth, ID band Patient awake    Reviewed: Allergy & Precautions, NPO status , Patient's Chart, lab work & pertinent test results, reviewed documented beta blocker date and time   History of Anesthesia Complications Negative for: history of anesthetic complications  Airway Mallampati: III  TM Distance: >3 FB Neck ROM: Full    Dental no notable dental hx.    Pulmonary sleep apnea    Pulmonary exam normal        Cardiovascular hypertension, Pt. on medications and Pt. on home beta blockers Normal cardiovascular exam     Neuro/Psych negative neurological ROS     GI/Hepatic Neg liver ROS,GERD  Medicated,,  Endo/Other  negative endocrine ROS    Renal/GU negative Renal ROS     Musculoskeletal negative musculoskeletal ROS (+)    Abdominal   Peds  Hematology negative hematology ROS (+)   Anesthesia Other Findings SCROTAL LESION  Reproductive/Obstetrics                              Anesthesia Physical Anesthesia Plan  ASA: 2  Anesthesia Plan: General   Post-op Pain Management: Tylenol PO (pre-op)* and Toradol IV (intra-op)*   Induction: Intravenous  PONV Risk Score and Plan: 2 and Treatment may vary due to age or medical condition, Ondansetron, Dexamethasone and Midazolam  Airway Management Planned: LMA  Additional Equipment: None  Intra-op Plan:   Post-operative Plan: Extubation in OR  Informed Consent: I have reviewed the patients History and Physical, chart, labs and discussed the procedure including the risks, benefits and alternatives for the proposed anesthesia with the patient or authorized representative who has indicated his/her understanding and acceptance.     Dental advisory given  Plan Discussed with: CRNA  Anesthesia Plan Comments:         Anesthesia Quick Evaluation

## 2023-01-12 NOTE — Transfer of Care (Signed)
Immediate Anesthesia Transfer of Care Note  Patient: Jacob Estrada  Procedure(s) Performed: SCROTAL EXPLORATION RIGHT VASECTOMY (Right)  Patient Location: PACU  Anesthesia Type:General  Level of Consciousness: drowsy and patient cooperative  Airway & Oxygen Therapy: Patient Spontanous Breathing  Post-op Assessment: Report given to RN and Post -op Vital signs reviewed and stable  Post vital signs: Reviewed and stable  Last Vitals:  Vitals Value Taken Time  BP 110/86 01/12/23 1057  Temp 36.7 C 01/12/23 1058  Pulse 69 01/12/23 1058  Resp 13 01/12/23 1058  SpO2 92 % 01/12/23 1058  Vitals shown include unfiled device data.  Last Pain:  Vitals:   01/12/23 0827  TempSrc: Oral  PainSc: 0-No pain      Patients Stated Pain Goal: 6 (01/12/23 0827)  Complications: No notable events documented.

## 2023-01-12 NOTE — Anesthesia Procedure Notes (Signed)
Procedure Name: LMA Insertion Date/Time: 01/12/2023 9:54 AM  Performed by: Dairl Ponder, CRNAPre-anesthesia Checklist: Patient identified, Emergency Drugs available, Suction available and Patient being monitored Patient Re-evaluated:Patient Re-evaluated prior to induction Oxygen Delivery Method: Circle System Utilized Preoxygenation: Pre-oxygenation with 100% oxygen Induction Type: IV induction Ventilation: Mask ventilation without difficulty LMA: LMA inserted LMA Size: 5.0 Number of attempts: 1 Airway Equipment and Method: Bite block Placement Confirmation: positive ETCO2 Tube secured with: Tape Dental Injury: Teeth and Oropharynx as per pre-operative assessment

## 2023-01-12 NOTE — H&P (Signed)
Office Visit Report     12/19/2022   --------------------------------------------------------------------------------   Jacob Estrada  MRN: [id number]  DOB: Aug 13, 1975, 47 year old Male  SSN:    PRIMARY CARE:  Gardiner Fanti, PA  PRIMARY CARE FAX:  [phone]  REFERRING:  Jannifer Hick, MD  PROVIDER:  Jettie Pagan, M.D.  LOCATION:  [facility] [phone]     --------------------------------------------------------------------------------   CC/HPI: Jacob Estrada is a 47 year old male he was seen in follow-up today with persistent right-sided scrotal discomfort and scrotal lesion.   1. 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 Ronne Binning. 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  -Approximately 1 year after the operation, he started noticing some scar tissue associated with a vasectomy in his bilateral scrotum. He was seen in the office in 09/2019 with complaints of dull aching in his right testicle and exam demonstrated nontender cystic area palpated along the right spermatic cord suggestive of benign sperm granuloma.  -He was seen in 03/2022 with worsening discomfort in his right spermatic cord.  -Scrotal ultrasound 03/16/2022 with 1.7 x 1.5 cm heterogeneous complex hypoechoic structure without internal vascularity along the right spermatic cord that was read as possible complex cyst versus abscess. By exam, this a typical appearance of a sperm granuloma.  -He underwent a Medrol Dosepak and meloxicam with significant reduction in size and decreased discomfort.  -He was initially scheduled for scrotal exploration and 5/24 however canceled this procedure as he had improvement. Over the past several months, he has noted intermittent drainage that is clear to white to green from his right scrotal lesion. He denies erythema, fevers,  chills.   Patient currently denies fever, chills, sweats, nausea, vomiting, abdominal or flank pain, gross hematuria or dysuria.     ALLERGIES: No Allergies    MEDICATIONS: Omeprazole  Atenolol  Atorvastatin Calcium     GU PSH: Vasectomy - 2020     NON-GU PSH: Knee Arthroscopy     GU PMH: Right testicular pain - 07/18/2022, - 06/09/2022, - 04/05/2022, - 03/08/2022, - 2021 Scrotal pain - 07/18/2022, - 06/09/2022, - 04/05/2022, - 03/08/2022 Encounter for sterilization - 2020, - 2020    NON-GU PMH: GERD Hypertension    FAMILY HISTORY: No Family History    SOCIAL HISTORY: Marital Status: Married Preferred Language: English; Ethnicity: Not Hispanic Or Latino; Race: White Current Smoking Status: Patient has never smoked.   Tobacco Use Assessment Completed: Used Tobacco in last 30 days? Does not use smokeless tobacco. Has never drank.  Does not use drugs. Drinks 2 caffeinated drinks per day. Has not had a blood transfusion. Patient's occupation Secretary/administrator.     Notes: ETOH socially    REVIEW OF SYSTEMS:    GU Review Male:   Patient denies frequent urination, hard to postpone urination, burning/ pain with urination, get up at night to urinate, leakage of urine, stream starts and stops, trouble starting your stream, have to strain to urinate , erection problems, and penile pain.  Gastrointestinal (Upper):   Patient denies nausea, vomiting, and indigestion/ heartburn.  Gastrointestinal (Lower):   Patient denies diarrhea and constipation.  Constitutional:   Patient denies fever, night sweats, weight loss, and fatigue.  Skin:   Patient denies skin rash/ lesion and itching.  Eyes:   Patient denies double vision and blurred vision.  Ears/ Nose/ Throat:   Patient denies sore throat and sinus  problems.  Hematologic/Lymphatic:   Patient denies swollen glands and easy bruising.  Cardiovascular:   Patient denies leg swelling and chest pains.  Respiratory:   Patient denies cough  and shortness of breath.  Endocrine:   Patient denies excessive thirst.  Musculoskeletal:   Patient denies back pain and joint pain.  Neurological:   Patient denies headaches and dizziness.  Psychologic:   Patient denies depression and anxiety.   VITAL SIGNS:      12/19/2022 03:01 PM  Weight 248 lb / 112.49 kg  Height 73 in / 185.42 cm  BP 123/79 mmHg  Pulse 73 /min  BMI 32.7 kg/m   GU PHYSICAL EXAMINATION:    Scrotum: Small subcentimeter lesion on the anterior right superior surface of his right scrotal wall. No spontaneous drainage. No tenderness. I did palpate the distal end of his vas deferens adjacent to this area with concern for intermittent cutaneous fistula.  Epididymides: Right: no spermatocele, no masses, no cysts, no tenderness, no induration, no enlargement. Left: no spermatocele, no masses, no cysts, no tenderness, no induration, no enlargement.  Testes: No tenderness, no swelling, no enlargement left testes. No tenderness, no swelling, no enlargement right testes. Normal location left testes. Normal location right testes. No mass, no cyst, no varicocele, no hydrocele left testes. No mass, no cyst, no varicocele, no hydrocele right testes.  Urethral Meatus: Normal size. No lesion, no wart, no discharge, no polyp. Normal location.  Penis: Circumcised, no warts, no cracks. No dorsal Peyronie's plaques, no left corporal Peyronie's plaques, no right corporal Peyronie's plaques, no scarring, no warts. No balanitis, no meatal stenosis.   MULTI-SYSTEM PHYSICAL EXAMINATION:    Constitutional: Well-nourished. No physical deformities. Normally developed. Good grooming.  Respiratory: No labored breathing, no use of accessory muscles.   Cardiovascular: Normal temperature, normal extremity pulses, no swelling, no varicosities.  Gastrointestinal: No mass, no tenderness, no rigidity, non obese abdomen.     Complexity of Data:  Source Of History:  Patient, Medical Record Summary  Records  Review:   Previous Doctor Records, Previous Patient Records  Urine Test Review:   Urinalysis   PROCEDURES:          Visit Complexity - G2211          Urinalysis - 81003 Dipstick Dipstick Cont'd  Color: Yellow Bilirubin: Neg mg/dL  Appearance: Clear Ketones: Neg mg/dL  Specific Gravity: 4.098 Blood: Neg ery/uL  pH: <=5.0 Protein: Neg mg/dL  Glucose: Neg mg/dL Urobilinogen: 0.2 mg/dL    Nitrites: Neg    Leukocyte Esterase: Neg leu/uL    ASSESSMENT:      ICD-10 Details  1 GU:   Scrotal pain - N50.82   2   Disorder of male genital organs, unspecified - N50.9    PLAN:           Document Letter(s):  Created for Patient: Clinical Summary         Notes:    #1. 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 I am able to palpate the vas deferens very near to the scrotal skin edge. We discussed that although there is no active drainage today, is possible that he has developed a cutaneous fistula from the distal vas deferens site. We discussed options including doing nothing, proceeding with warm compresses or scrotal exploration with repeat right side vasectomy and skin closure to reduce risk of fistula. We discussed risk of infection, bleeding, recurrence, pain, rare  risk of testicular injury. He understand these risks and elects to proceed. Surgery letter sent.   Urology Preoperative H&P   Chief Complaint: Scrotal lesion  History of Present Illness: Tami Tardo is a 47 y.o. male with a history of a scrotal lesion following vasectomy here for scrotal exploration, possible right vasectomy.    Past Medical History:  Diagnosis Date   GERD (gastroesophageal reflux disease)    Hypertension    Palpitations    Sleep apnea    Wears contact lenses     Past Surgical History:  Procedure Laterality Date   right meniscus repair     yrs ago    Allergies: No Known  Allergies  History reviewed. No pertinent family history.  Social History:  reports that he has never smoked. He has never used smokeless tobacco. He reports current alcohol use. He reports that he does not use drugs.  ROS: A complete review of systems was performed.  All systems are negative except for pertinent findings as noted.  Physical Exam:  Vital signs in last 24 hours: Temp:  [98.3 F (36.8 C)] 98.3 F (36.8 C) (09/13 0827) Pulse Rate:  [65] 65 (09/13 0827) Resp:  [17] 17 (09/13 0827) BP: (139)/(97) 139/97 (09/13 0827) SpO2:  [99 %] 99 % (09/13 0827) Weight:  [409 kg] 112 kg (09/13 0827) Constitutional:  Alert and oriented, No acute distress Cardiovascular: Regular rate and rhythm Respiratory: Normal respiratory effort, Lungs clear bilaterally GI: Abdomen is soft, nontender, nondistended, no abdominal masses GU: No CVA tenderness Lymphatic: No lymphadenopathy Neurologic: Grossly intact, no focal deficits Psychiatric: Normal mood and affect  Laboratory Data:  No results for input(s): "WBC", "HGB", "HCT", "PLT" in the last 72 hours.  No results for input(s): "NA", "K", "CL", "GLUCOSE", "BUN", "CALCIUM", "CREATININE" in the last 72 hours.  Invalid input(s): "CO3"   No results found for this or any previous visit (from the past 24 hour(s)). No results found for this or any previous visit (from the past 240 hour(s)).  Renal Function: No results for input(s): "CREATININE" in the last 168 hours. CrCl cannot be calculated (Patient's most recent lab result is older than the maximum 21 days allowed.).  Radiologic Imaging: No results found.  I independently reviewed the above imaging studies.  Assessment and Plan Faruq Hagee is a 47 y.o. male with scrotal lesion following vasectomy here for scrotal exploration, possible right vasectomy.  Vasectomy consent- We discussed the vasectomy procedure in detail.  We went over the pertinent anatomy of the scrotum and vasa.  I have discussed with the patient the fact that the procedure is office-based.   We then discussed the potential risks and complications of this procedure, including but not limited to epididymitis, orchalgia (typically transient discomfort) and scrotal ecchymosis/swelling after the procedure, which can occur on one or both sides; sperm granuloma, which was described in detail as being caused by the presence of some sperm leaking out from the severed ends of the vasa causing an inflammatory reaction and oftentimes resulting in a small round area of scar that may persist; hematoma and bleeding into the scrotum, which can be significant and possibly require a second surgery to drain the accumulated hematoma, as well as how to prevent this by decreased activity level. We discussed superficial skin infection, which is rare, and abscess formation, which is exceedingly rare and may require drainage as well.  We discussed the potential for loss of the testicle(s) secondary to procedural complications. Finally, we went over recanalization, which  can occur in 1 in 2000 cases. He voices understanding and wishes to proceed.   Matt R. Teonna Coonan MD 01/12/2023, 8:37 AM  [facility] Pager: [phone]): [phone]

## 2023-01-12 NOTE — Anesthesia Postprocedure Evaluation (Signed)
Anesthesia Post Note  Patient: Jacob Estrada  Procedure(s) Performed: SCROTAL EXPLORATION RIGHT VASECTOMY (Right)     Patient location during evaluation: PACU Anesthesia Type: General Level of consciousness: awake and alert Pain management: pain level controlled Vital Signs Assessment: post-procedure vital signs reviewed and stable Respiratory status: spontaneous breathing, nonlabored ventilation and respiratory function stable Cardiovascular status: blood pressure returned to baseline Postop Assessment: no apparent nausea or vomiting Anesthetic complications: no   No notable events documented.  Last Vitals:  Vitals:   01/12/23 1100 01/12/23 1115  BP: 120/81   Pulse: 72   Resp: 13   Temp:  36.5 C  SpO2: 91% 95%    Last Pain:  Vitals:   01/12/23 1115  TempSrc:   PainSc: 0-No pain                 Shanda Howells

## 2023-01-12 NOTE — Discharge Instructions (Addendum)
Activity:  You are encouraged to ambulate frequently (about every hour during waking hours) to help prevent blood clots from forming in your legs or lungs.  However, you should not engage in any heavy lifting (> 10-15 lbs), strenuous activity, or straining.  Diet: You should advance your diet as instructed by your physician.  It will be normal to have some bloating, nausea, and abdominal discomfort intermittently.  Prescriptions:  You will be provided a prescription for pain medication to take as needed.  If your pain is not severe enough to require the prescription pain medication, you may take extra strength Tylenol instead which will have less side effects.  You should also take a prescribed stool softener to avoid straining with bowel movements as the prescription pain medication may constipate you.  Incisions: You may remove your dressing bandages 48 hours after surgery if not removed in the hospital.  You will either have some small staples or special tissue glue at each of the incision sites. Once the bandages are removed (if present), the incisions may stay open to air.  You may start showering (but not soaking or bathing in water) the 2nd day after surgery and the incisions simply need to be patted dry after the shower.  No additional care is needed.  What to call us about: You should call the office [phone]) if you develop fever > 101 or develop persistent vomiting. Activity:  You are encouraged to ambulate frequently (about every hour during waking hours) to help prevent blood clots from forming in your legs or lungs.  However, you should not engage in any heavy lifting (> 10-15 lbs), strenuous activity, or straining.  Post Anesthesia Home Care Instructions  Activity: Get plenty of rest for the remainder of the day. A responsible individual must stay with you for 24 hours following the procedure.  For the next 24 hours, DO NOT: -Drive a car -Advertising copywriter -Drink alcoholic  beverages -Take any medication unless instructed by your physician -Make any legal decisions or sign important papers.  Meals: Start with liquid foods such as gelatin or soup. Progress to regular foods as tolerated. Avoid greasy, spicy, heavy foods. If nausea and/or vomiting occur, drink only clear liquids until the nausea and/or vomiting subsides. Call your physician if vomiting continues.  Special Instructions/Symptoms: Your throat may feel dry or sore from the anesthesia or the breathing tube placed in your throat during surgery. If this causes discomfort, gargle with warm salt water. The discomfort should disappear within 24 hours.  No acetaminophen/Tylenol until after 2:15 pm today if needed. No ibuprofen, Advil, Aleve, Motrin, ketorolac, meloxicam, naproxen, or other NSAIDS until after 4:30 pm today if needed.

## 2023-01-15 ENCOUNTER — Encounter (HOSPITAL_BASED_OUTPATIENT_CLINIC_OR_DEPARTMENT_OTHER): Payer: Self-pay | Admitting: Urology

## 2023-01-15 LAB — SURGICAL PATHOLOGY

## 2024-05-28 ENCOUNTER — Encounter (HOSPITAL_BASED_OUTPATIENT_CLINIC_OR_DEPARTMENT_OTHER): Payer: Self-pay

## 2024-05-28 ENCOUNTER — Ambulatory Visit (HOSPITAL_BASED_OUTPATIENT_CLINIC_OR_DEPARTMENT_OTHER): Admit: 2024-05-28 | Admitting: Orthopaedic Surgery

## 2024-05-28 SURGERY — RECONSTRUCTION, TENDON, ACHILLES
Anesthesia: General | Laterality: Left
